# Patient Record
Sex: Male | Born: 1955 | Race: White | Hispanic: No | Marital: Married | Smoking: Current every day smoker
Health system: Southern US, Community
[De-identification: ages and names within clinical notes are randomized; demographics above are authoritative.]

## PROBLEM LIST (undated history)

## (undated) DIAGNOSIS — S83419A Sprain of medial collateral ligament of unspecified knee, initial encounter: Secondary | ICD-10-CM

## (undated) DIAGNOSIS — E119 Type 2 diabetes mellitus without complications: Secondary | ICD-10-CM

## (undated) DIAGNOSIS — J0510 Acute epiglottitis without obstruction: Secondary | ICD-10-CM

## (undated) DIAGNOSIS — S83207A Unspecified tear of unspecified meniscus, current injury, left knee, initial encounter: Secondary | ICD-10-CM

## (undated) DIAGNOSIS — M1990 Unspecified osteoarthritis, unspecified site: Secondary | ICD-10-CM

## (undated) DIAGNOSIS — S83512A Sprain of anterior cruciate ligament of left knee, initial encounter: Secondary | ICD-10-CM

## (undated) DIAGNOSIS — T148XXA Other injury of unspecified body region, initial encounter: Secondary | ICD-10-CM

## (undated) HISTORY — PX: KNEE SURGERY: SHX244

## (undated) HISTORY — DX: Unspecified tear of unspecified meniscus, current injury, left knee, initial encounter: S83.207A

## (undated) HISTORY — DX: Other injury of unspecified body region, initial encounter: T14.8XXA

## (undated) HISTORY — PX: VASECTOMY: SHX75

## (undated) HISTORY — DX: Sprain of medial collateral ligament of unspecified knee, initial encounter: S83.419A

## (undated) HISTORY — DX: Sprain of anterior cruciate ligament of left knee, initial encounter: S83.512A

## (undated) HISTORY — DX: Type 2 diabetes mellitus without complications: E11.9

## (undated) HISTORY — DX: Acute epiglottitis without obstruction: J05.10

## (undated) HISTORY — PX: TONSILLECTOMY: SUR1361

---

## 2012-01-08 ENCOUNTER — Ambulatory Visit: Payer: Self-pay | Admitting: Family Medicine

## 2013-11-03 ENCOUNTER — Emergency Department: Payer: Self-pay | Admitting: Emergency Medicine

## 2014-09-06 ENCOUNTER — Encounter: Payer: Self-pay | Admitting: Family Medicine

## 2014-09-06 ENCOUNTER — Ambulatory Visit (INDEPENDENT_AMBULATORY_CARE_PROVIDER_SITE_OTHER): Payer: BLUE CROSS/BLUE SHIELD | Admitting: Family Medicine

## 2014-09-06 VITALS — BP 117/75 | HR 83 | Temp 98.7°F | Ht 70.0 in | Wt 236.0 lb

## 2014-09-06 DIAGNOSIS — E1142 Type 2 diabetes mellitus with diabetic polyneuropathy: Secondary | ICD-10-CM | POA: Diagnosis not present

## 2014-09-06 DIAGNOSIS — E119 Type 2 diabetes mellitus without complications: Secondary | ICD-10-CM | POA: Insufficient documentation

## 2014-09-06 DIAGNOSIS — G629 Polyneuropathy, unspecified: Secondary | ICD-10-CM | POA: Diagnosis not present

## 2014-09-06 DIAGNOSIS — E785 Hyperlipidemia, unspecified: Secondary | ICD-10-CM | POA: Diagnosis not present

## 2014-09-06 NOTE — Progress Notes (Signed)
BP 117/75 mmHg  Pulse 83  Temp(Src) 98.7 F (37.1 C)  Ht 5\' 10"  (1.778 m)  Wt 236 lb (107.049 kg)  BMI 33.86 kg/m2  SpO2 94%   Subjective:    Patient ID: Randall Bowman, male    DOB: [birth year], 59 y.o.   MRN: [id number]  HPI: Randall Bowman is a 59 y.o. male  Chief Complaint  Patient presents with  . Diabetes   patient follow-up from [place] in May for reestablishing care for diabetes wants to reestablish care here. Patient is taken medical advice to heart and is Bowman really well losing 12 pounds and only taking metformin 752 a day. Is taking this medication faithfully with no side effects and no low blood sugar spells. Patient's home glucose monitoring shows slow gradual decline into the low to mid 100s range. His tests are all fasting. Is not been checking nonfasting glucoses. Reviewed notes from [place] and labs. Indicating elevated cholesterol normal renal function and of course poor control of diabetes. Patient has been going to his eye doctor on a regular basis will see him again this fall and will have a report sent to [place]. The condition that drove the patient to the doctor in the first place of peripheral neuropathy continues unabated with continued marked lethargy.. The lethargy has improved markedly. Patient wants to defer any neuropathy treatment for the time being to see a full improved spontaneously. Relevant past medical, surgical, family and social history reviewed and updated as indicated. Interim medical history since our last visit reviewed. Allergies and medications reviewed and updated.  Review of Systems  Constitutional: Negative.   Respiratory: Negative.   Cardiovascular: Negative.     Per HPI unless specifically indicated above     Objective:    BP 117/75 mmHg  Pulse 83  Temp(Src) 98.7 F (37.1 C)  Ht 5\' 10"  (1.778 m)  Wt 236 lb (107.049 kg)  BMI 33.86 kg/m2  SpO2 94%  Wt Readings from Last 3 Encounters:  09/06/14 236 lb (107.049 kg)  01/08/12  257 lb (116.574 kg)    Physical Exam  Constitutional: He is oriented to person, place, and time. He appears well-developed and well-nourished. No distress.  HENT:  Head: Normocephalic and atraumatic.  Right Ear: Hearing normal.  Left Ear: Hearing normal.  Nose: Nose normal.  Eyes: Conjunctivae and lids are normal. Right eye exhibits no discharge. Left eye exhibits no discharge. No scleral icterus.  Pulmonary/Chest: Effort normal. No respiratory distress. He has wheezes.  Musculoskeletal: Normal range of motion.  Neurological: He is alert and oriented to person, place, and time.  Foot exam actually normal with normal sensation but his feet do hurt  Skin: Skin is intact. No rash noted.  Psychiatric: He has a normal mood and affect. His speech is normal and behavior is normal. Judgment and thought content normal. Cognition and memory are normal.    No results found for this or any previous visit.    Assessment & Plan:   Problem List Items Addressed This Visit      Nervous and Auditory   Diabetic peripheral neuropathy associated with type 2 diabetes mellitus - Primary    Diabetes with marked improvement with weight loss and starting metformin      Relevant Medications   metFORMIN (GLUCOPHAGE-XR) 750 MG 24 hr tablet   Other Relevant Orders   Bayer DCA Hb A1c Waived   Basic metabolic panel     Other   Hyperlipidemia   Relevant Orders  LP+ALT+AST+Glu Piccolo, Waived       Follow up plan: Return in about 2 months (around 11/06/2014) for recheck.

## 2014-09-06 NOTE — Assessment & Plan Note (Signed)
Diabetes with marked improvement with weight loss and starting metformin

## 2014-10-07 ENCOUNTER — Encounter: Payer: Self-pay | Admitting: Emergency Medicine

## 2014-10-07 ENCOUNTER — Emergency Department: Payer: BLUE CROSS/BLUE SHIELD

## 2014-10-07 ENCOUNTER — Emergency Department
Admission: EM | Admit: 2014-10-07 | Discharge: 2014-10-07 | Disposition: A | Discharge status: Home / Self Care | Payer: BLUE CROSS/BLUE SHIELD | Attending: Emergency Medicine | Admitting: Emergency Medicine

## 2014-10-07 DIAGNOSIS — E1142 Type 2 diabetes mellitus with diabetic polyneuropathy: Secondary | ICD-10-CM | POA: Diagnosis not present

## 2014-10-07 DIAGNOSIS — Z720 Tobacco use: Secondary | ICD-10-CM | POA: Insufficient documentation

## 2014-10-07 DIAGNOSIS — J029 Acute pharyngitis, unspecified: Secondary | ICD-10-CM | POA: Diagnosis present

## 2014-10-07 DIAGNOSIS — J0510 Acute epiglottitis without obstruction: Secondary | ICD-10-CM | POA: Diagnosis not present

## 2014-10-07 DIAGNOSIS — Z79899 Other long term (current) drug therapy: Secondary | ICD-10-CM | POA: Insufficient documentation

## 2014-10-07 DIAGNOSIS — Z7982 Long term (current) use of aspirin: Secondary | ICD-10-CM | POA: Diagnosis not present

## 2014-10-07 LAB — POCT RAPID STREP A: STREPTOCOCCUS, GROUP A SCREEN (DIRECT): NEGATIVE

## 2014-10-07 MED ORDER — MAGIC MOUTHWASH W/LIDOCAINE: 5.0000 mL | Freq: Four times a day (QID) | ORAL | Status: DC

## 2014-10-07 MED ORDER — DEXAMETHASONE SODIUM PHOSPHATE 10 MG/ML IJ SOLN
10.0000 mg | Freq: Once | INTRAMUSCULAR | Status: AC
  Administered 2014-10-07: 10 mg via INTRAMUSCULAR
  Filled 2014-10-07: qty 1

## 2014-10-07 MED ORDER — CEFTRIAXONE SODIUM 1 G IJ SOLR
1.0000 g | Freq: Once | INTRAMUSCULAR | Status: AC
  Administered 2014-10-07: 1 g via INTRAMUSCULAR
  Filled 2014-10-07: qty 10

## 2014-10-07 MED ORDER — METHYLPREDNISOLONE 4 MG PO TBPK: ORAL_TABLET | ORAL | Status: DC

## 2014-10-07 MED ORDER — LIDOCAINE HCL (PF) 1 % IJ SOLN
INTRAMUSCULAR | Status: AC
  Administered 2014-10-07: 1 mL
  Filled 2014-10-07: qty 5

## 2014-10-07 MED ORDER — TRAMADOL HCL 50 MG PO TABS: 50.0000 mg | ORAL_TABLET | Freq: Four times a day (QID) | ORAL | Status: DC | PRN

## 2014-10-07 MED ORDER — AMOXICILLIN-POT CLAVULANATE 875-125 MG PO TABS: 1.0000 | ORAL_TABLET | Freq: Two times a day (BID) | ORAL | Status: DC

## 2014-10-07 MED ORDER — HYDROMORPHONE HCL 1 MG/ML IJ SOLN
1.0000 mg | Freq: Once | INTRAMUSCULAR | Status: AC
  Administered 2014-10-07: 1 mg via INTRAMUSCULAR
  Filled 2014-10-07: qty 1

## 2014-10-07 NOTE — Discharge Instructions (Signed)
Take medications as directed and follow up in 48 hours .  Return to ER if condition worsen.

## 2014-10-07 NOTE — ED Provider Notes (Addendum)
[facility] Emergency Department Provider Note  ____________________________________________  Time seen: Approximately 7:10 PM  I have reviewed the triage vital signs and the nursing notes.   HISTORY  Chief Complaint Sore Throat    HPI Randall Bowman is a 59 y.o. male patient complains that they of sore throat and difficulty swallowing. Patient is concerned secondary to being diagnosed with epiglottitis last year having the same complaint and feels at this time. Patient denies any URI signs and symptoms. Patient denies any fever. Patient states they've to tolerate fluids but having difficulty with solid foods. Past Medical History  Diagnosis Date  . Tears of meniscus and ACL of left knee   . Tear of MCL (medial collateral ligament) of knee   . Joint capsule rupture   . Diabetes mellitus without complication     Patient Active Problem List   Diagnosis Date Noted  . Diabetic peripheral neuropathy associated with type 2 diabetes mellitus 09/06/2014  . Hyperlipidemia 09/06/2014  . Diabetes mellitus without complication     Past Surgical History  Procedure Laterality Date  . Vasectomy    . Tonsillectomy      Current Outpatient Rx  Name  Route  Sig  Dispense  Refill  . amoxicillin-clavulanate (AUGMENTIN) 875-125 MG per tablet   Oral   Take 1 tablet by mouth 2 (two) times daily.   20 tablet   0   . aspirin EC 81 MG tablet   Oral   Take 81 mg by mouth daily.         . magic mouthwash w/lidocaine SOLN   Oral   Take 5 mLs by mouth 4 (four) times daily.   100 mL   0   . metFORMIN (GLUCOPHAGE-XR) 750 MG 24 hr tablet   Oral   Take by mouth.         . methylPREDNISolone (MEDROL DOSEPAK) 4 MG TBPK tablet      Take Tapered dose as directed   21 tablet   0   . traMADol (ULTRAM) 50 MG tablet   Oral   Take 1 tablet (50 mg total) by mouth every 6 (six) hours as needed for moderate pain.   12 tablet   0   . vardenafil (LEVITRA) 20 MG  tablet   Oral   Take 20 mg by mouth daily as needed for erectile dysfunction.           Allergies Review of patient's allergies indicates no known allergies.  Family History  Problem Relation Age of Onset  . Cancer Father   . Cancer Brother     Social History Social History  Substance Use Topics  . Smoking status: Current Every Day Smoker    Types: Cigarettes  . Smokeless tobacco: Never Used  . Alcohol Use: Yes    Review of Systems Constitutional: No fever/chills Eyes: No visual changes. ENT: No sore throat. Cardiovascular: Denies chest pain. Respiratory: Denies shortness of breath. Gastrointestinal: No abdominal pain.  No nausea, no vomiting.  No diarrhea.  No constipation. Genitourinary: Negative for dysuria. Musculoskeletal: Negative for back pain. Skin: Negative for rash. Neurological: Negative for headaches, focal weakness or numbness. 10-point ROS otherwise negative.  ____________________________________________   PHYSICAL EXAM:  VITAL SIGNS: ED Triage Vitals  Enc Vitals Group     BP 10/07/14 1850 130/75 mmHg     Pulse Rate 10/07/14 1850 87     Resp 10/07/14 1850 18     Temp 10/07/14 1850 98.3 F (36.8 C)  Temp Source 10/07/14 1850 Oral     SpO2 10/07/14 1850 96 %     Weight 10/07/14 1850 232 lb (105.235 kg)     Height 10/07/14 1850 5\' 10"  (1.778 m)     Head Cir --      Peak Flow --      Pain Score 10/07/14 1901 6     Pain Loc --      Pain Edu? --      Excl. in [place]? --     Constitutional: Alert and oriented. Well appearing and in no acute distress. Eyes: Conjunctivae are normal. PERRL. EOMI. Head: Atraumatic. Nose: No congestion/rhinnorhea. Mouth/Throat: Mucous membranes are moist.  Oropharynx erythematous. Neck: No stridor.   Hematological/Lymphatic/Immunilogical: No cervical lymphadenopathy. Cardiovascular: Normal rate, regular rhythm. Grossly normal heart sounds.  Good peripheral circulation. Respiratory: Normal respiratory effort.   No retractions. Lungs CTAB. Gastrointestinal: Soft and nontender. No distention. No abdominal bruits. No CVA tenderness. Musculoskeletal: No lower extremity tenderness nor edema.  No joint effusions. Neurologic:  Normal speech and language. No gross focal neurologic deficits are appreciated. No gait instability. Skin:  Skin is warm, dry and intact. No rash noted. Psychiatric: Mood and affect are normal. Speech and behavior are normal.  ____________________________________________   LABS (all labs ordered are listed, but only abnormal results are displayed)  Labs Reviewed  POCT RAPID STREP A   ____________________________________________  EKG   ____________________________________________  RADIOLOGY  Soft tissue neck findings consistent with mild epiglottitis. ____________________________________________   PROCEDURES  Procedure(s) performed: None  Critical Care performed: No  ____________________________________________   INITIAL IMPRESSION / ASSESSMENT AND PLAN / ED COURSE  Pertinent labs & imaging results that were available during my care of the patient were reviewed by me and considered in my medical decision making (see chart for details).  Epiglottitis. Patient given Decadron and Rocephin IM in the ER. Patient discharged with Augmentin and prednisone. Patient also given a prescription for Magic mouthwash. Patient is advised to follow-up with family doctor in 2 days. Return by ER if condition worsens. ____________________________________________   FINAL CLINICAL IMPRESSION(S) / ED DIAGNOSES  Final diagnoses:  Acute epiglottiditis      Sable Feil, PA-C 10/07/14 2139  Orbie Pyo, MD 10/08/14 0008  Called the patient at home. He said that he is feeling improved and is able to swallow and is controlling his secretions. He says that he has been treated successfully outpatient with epiglottitis before by an ear nose and throat doctor  [place]. He says that he feels exactly the same as when he was last treated successfully as an outpatient about one year ago. I recommended to him that he come back to the hospital to be admitted for this diagnosis. However, the patient says that he would prefer to stay at home and that he lives only 2 minutes away from the hospital. He says his wife is there to supervise him. He is aware of the potentially rapid airway obstruction and catastrophic consequences of worsening epiglottitis. Despite this he still is deciding to be treated as an outpatient. He did not sound intoxicated and had clear insight into his disease.  Orbie Pyo, MD 10/08/14 [phone]  Patient called back to the emergency department to ask for my name.  He thanked me for my concern in calling him but says that his wife will likely be filing a complaint.  I again recommended that he return to the emergency department but he says that he would still rather  continue his treatment at home. He is aware that this diagnosis may result in closure of his airway and death and that the disease may progress rapidly.  Despite this, he still would rather be treated from home.    Orbie Pyo, MD 10/08/14 [phone]

## 2014-10-07 NOTE — ED Notes (Signed)
Patient D/C in wife's care.

## 2014-10-10 ENCOUNTER — Ambulatory Visit (INDEPENDENT_AMBULATORY_CARE_PROVIDER_SITE_OTHER): Payer: BLUE CROSS/BLUE SHIELD | Admitting: Family Medicine

## 2014-10-10 ENCOUNTER — Encounter: Payer: Self-pay | Admitting: Family Medicine

## 2014-10-10 VITALS — BP 116/75 | HR 75 | Temp 97.4°F | Wt 234.0 lb

## 2014-10-10 DIAGNOSIS — J0510 Acute epiglottitis without obstruction: Secondary | ICD-10-CM | POA: Diagnosis not present

## 2014-10-10 MED ORDER — PREDNISONE 10 MG PO TABS: ORAL_TABLET | ORAL | Status: DC

## 2014-10-10 NOTE — Progress Notes (Signed)
BP 116/75 mmHg  Pulse 75  Temp(Src) 97.4 F (36.3 C)  Wt 234 lb (106.142 kg)  SpO2 97%   Subjective:    Patient ID: Randall Bowman, male    DOB: 20-Sep-1955, 59 y.o.   MRN: [id number]  HPI: Randall Bowman is a 59 y.o. male  Chief Complaint  Patient presents with  . ER follow up    Acute epiglottiditis- was given steriods and antibiotics which he is currently taking he was also given pain medications which he does does not take.Patient states that he felt better yesterday than he did today.   ER FOLLOW UP Time since discharge: 3 days Hospital/facility: [facility] Diagnosis: Acute epiglotitis Procedures/tests: x-ray, rapid strep (neg) Consultants: None New medications: prednisone taper, augmentin, magic mouth wash Discharge instructions:  Follow up with PCP Status: better- starting to feel sore in his throat again on the 40mg  of prednisone.   Has had epiglottitis 2 times previously, 1 time was in ICU for several days, last time was treated by ENT. This was in [place] several years ago. Never found out why he was getting it. This time, went to hospital and sat for 4 hours, then sent home with medrol dose pack and augmentin. Doctor called him several hours later and asked him to come back to the ER for admission, but he refused. He states that he is feeling better, controlling his secretions and is OK, but not feeling right. Taking his medicine as recommended.   Relevant past medical, surgical, family and social history reviewed and updated as indicated. Interim medical history since our last visit reviewed. Allergies and medications reviewed and updated.  Review of Systems  Constitutional: Negative.   HENT: Positive for sore throat and voice change. Negative for congestion, dental problem, drooling, ear discharge, ear pain, facial swelling, hearing loss, mouth sores, nosebleeds, postnasal drip, rhinorrhea, sinus pressure, sneezing, tinnitus and trouble swallowing.   Respiratory:  Negative.   Cardiovascular: Negative.   Skin: Negative.     Per HPI unless specifically indicated above     Objective:    BP 116/75 mmHg  Pulse 75  Temp(Src) 97.4 F (36.3 C)  Wt 234 lb (106.142 kg)  SpO2 97%  Wt Readings from Last 3 Encounters:  10/10/14 234 lb (106.142 kg)  10/07/14 232 lb (105.235 kg)  09/06/14 236 lb (107.049 kg)    Physical Exam  Constitutional: He is oriented to person, place, and time. He appears well-developed and well-nourished. No distress.  HENT:  Head: Normocephalic and atraumatic.  Right Ear: Hearing normal.  Left Ear: Hearing normal.  Nose: Nose normal.  Mouth/Throat: Oropharynx is clear and moist. No oropharyngeal exudate.  Eyes: Conjunctivae and lids are normal. Right eye exhibits no discharge. Left eye exhibits no discharge. No scleral icterus.  Neck: No JVD present. No tracheal deviation present. No thyromegaly present.  Swelling under his jaw  Cardiovascular: Normal rate, regular rhythm and intact distal pulses.  Exam reveals no gallop and no friction rub.   No murmur heard. Pulmonary/Chest: Effort normal and breath sounds normal. No stridor. No respiratory distress. He has no wheezes. He has no rales. He exhibits no tenderness.  Musculoskeletal: Normal range of motion.  Lymphadenopathy:    He has cervical adenopathy.  Neurological: He is alert and oriented to person, place, and time.  Skin: Skin is intact. No rash noted.  Psychiatric: He has a normal mood and affect. His speech is normal and behavior is normal. Judgment and thought content normal. Cognition and memory are  normal.  Nursing note and vitals reviewed.   Results for orders placed or performed during the hospital encounter of 10/07/14  POCT rapid strep A [facility])  Result Value Ref Range   Streptococcus, Group A Screen (Direct) NEGATIVE NEGATIVE      Assessment & Plan:   Problem List Items Addressed This Visit      Respiratory   Acute epiglottitis without  obstruction - Primary    Day 4- on antibiotics, controlling secretions, will increase his prednisone to 3 day 60 mg taper, and have him back here in 1-2 days to see how he is doing. Advised him to return to the hospital immediately if he feels at all short of breath or has trouble controlling his secretions. He is aware of the severity of this illness. Referral to ENT generated today- patient has appointment set up with them on 10/24/14-  He will return here on Thursday.       Relevant Orders   Ambulatory referral to ENT       Follow up plan: Return in about 2 days (around 10/12/2014).

## 2014-10-10 NOTE — Assessment & Plan Note (Addendum)
Day 4- on antibiotics, controlling secretions, will increase his prednisone to 3 day 60 mg taper, and have him back here in 1-2 days to see how he is doing. Advised him to return to the hospital immediately if he feels at all short of breath or has trouble controlling his secretions. He is aware of the severity of this illness. Referral to ENT generated today- patient has appointment set up with them on 10/24/14-  He will return here on Thursday.

## 2014-10-12 ENCOUNTER — Ambulatory Visit (INDEPENDENT_AMBULATORY_CARE_PROVIDER_SITE_OTHER): Payer: BLUE CROSS/BLUE SHIELD | Admitting: Family Medicine

## 2014-10-12 ENCOUNTER — Encounter: Payer: Self-pay | Admitting: Family Medicine

## 2014-10-12 VITALS — BP 127/82 | HR 72 | Temp 96.4°F | Wt 233.0 lb

## 2014-10-12 DIAGNOSIS — J0510 Acute epiglottitis without obstruction: Secondary | ICD-10-CM

## 2014-10-12 NOTE — Progress Notes (Signed)
BP 127/82 mmHg  Pulse 72  Temp(Src) 96.4 F (35.8 C)  Wt 233 lb (105.688 kg)  SpO2 98%   Subjective:    Patient ID: Randall Bowman, male    DOB: [birth year], 59 y.o.   MRN: [id number]  HPI: Randall Bowman is a 59 y.o. male  Chief Complaint  Patient presents with  . Sore Throat   No problems swallowing, feeling much better. He does note that he is hungry and angry with the prednisone and that his sugars have been really up. He's concerned about his A1c. He is seeing the ENT in 1 week. No other concerns or complaints.   Relevant past medical, surgical, family and social history reviewed and updated as indicated. Interim medical history since our last visit reviewed. Allergies and medications reviewed and updated.  Review of Systems  Constitutional: Negative.   HENT: Negative.   Respiratory: Negative.   Cardiovascular: Negative.   Psychiatric/Behavioral: Negative.     Per HPI unless specifically indicated above     Objective:    BP 127/82 mmHg  Pulse 72  Temp(Src) 96.4 F (35.8 C)  Wt 233 lb (105.688 kg)  SpO2 98%  Wt Readings from Last 3 Encounters:  10/12/14 233 lb (105.688 kg)  10/10/14 234 lb (106.142 kg)  10/07/14 232 lb (105.235 kg)    Physical Exam  Constitutional: He is oriented to person, place, and time. He appears well-developed and well-nourished. No distress.  HENT:  Head: Normocephalic and atraumatic.  Right Ear: Hearing normal.  Left Ear: Hearing normal.  Nose: Nose normal.  Mouth/Throat: Oropharynx is clear and moist. No oropharyngeal exudate.  Eyes: Conjunctivae and lids are normal. Right eye exhibits no discharge. Left eye exhibits no discharge. No scleral icterus.  Neck: Normal range of motion. No JVD present. No tracheal deviation present. No thyromegaly present.  Cardiovascular: Normal rate, regular rhythm, normal heart sounds and intact distal pulses.  Exam reveals no gallop and no friction rub.   No murmur heard. Pulmonary/Chest: Effort  normal and breath sounds normal. No stridor. No respiratory distress. He has no wheezes. He has no rales. He exhibits no tenderness.  Musculoskeletal: Normal range of motion.  Lymphadenopathy:    He has no cervical adenopathy.  Neurological: He is alert and oriented to person, place, and time.  Skin: Skin is intact. No rash noted.  Psychiatric: He has a normal mood and affect. His speech is normal and behavior is normal. Judgment and thought content normal. Cognition and memory are normal.  Nursing note and vitals reviewed.   Results for orders placed or performed during the hospital encounter of 10/07/14  POCT rapid strep A [facility])  Result Value Ref Range   Streptococcus, Group A Screen (Direct) NEGATIVE NEGATIVE      Assessment & Plan:   Problem List Items Addressed This Visit      Respiratory   Acute epiglottitis without obstruction - Primary    Doing well on the prednisone. To see ENT on the 23rd. He will call if pain increases or he feels more swollen when he goes down to the 50mg  of prednisone.           Follow up plan: Return As scheduled.

## 2014-10-12 NOTE — Assessment & Plan Note (Signed)
Doing well on the prednisone. To see ENT on the 23rd. He will call if pain increases or he feels more swollen when he goes down to the 50mg  of prednisone.

## 2014-11-02 ENCOUNTER — Ambulatory Visit: Payer: BLUE CROSS/BLUE SHIELD | Admitting: Family Medicine

## 2014-11-07 ENCOUNTER — Other Ambulatory Visit (INDEPENDENT_AMBULATORY_CARE_PROVIDER_SITE_OTHER): Payer: BLUE CROSS/BLUE SHIELD

## 2014-11-07 DIAGNOSIS — E785 Hyperlipidemia, unspecified: Secondary | ICD-10-CM

## 2014-11-07 DIAGNOSIS — E1142 Type 2 diabetes mellitus with diabetic polyneuropathy: Secondary | ICD-10-CM

## 2014-11-07 DIAGNOSIS — Z23 Encounter for immunization: Secondary | ICD-10-CM | POA: Diagnosis not present

## 2014-11-07 LAB — BAYER DCA HB A1C WAIVED: HB A1C (BAYER DCA - WAIVED): 7.4 % — ABNORMAL HIGH (ref <7.0)

## 2014-11-07 LAB — LP+ALT+AST+GLU PICCOLO, WAIVED
ALT (SGPT) Piccolo, Waived: 25 U/L (ref 10–47)
AST (SGOT) Piccolo, Waived: 27 U/L (ref 11–38)
CHOL/HDL RATIO PICCOLO,WAIVE: 6.8 mg/dL — AB
Cholesterol Piccolo, Waived: 200 mg/dL — ABNORMAL HIGH (ref <200)
Glucose Piccolo, Waived: 149 mg/dL — ABNORMAL HIGH (ref 73–118)
HDL CHOL PICCOLO, WAIVED: 29 mg/dL — AB (ref >59)
LDL CHOL CALC PICCOLO WAIVED: 135 mg/dL — AB (ref <100)
TRIGLYCERIDES PICCOLO,WAIVED: 180 mg/dL — AB (ref <150)
VLDL CHOL CALC PICCOLO,WAIVE: 36 mg/dL — AB (ref <30)

## 2014-11-08 LAB — BASIC METABOLIC PANEL
BUN/Creatinine Ratio: 19 (ref 9–20)
BUN: 15 mg/dL (ref 6–24)
CALCIUM: 10.6 mg/dL — AB (ref 8.7–10.2)
CHLORIDE: 102 mmol/L (ref 97–108)
CO2: 24 mmol/L (ref 18–29)
Creatinine, Ser: 0.78 mg/dL (ref 0.76–1.27)
GFR calc Af Amer: 115 mL/min/{1.73_m2} (ref >59)
GFR, EST NON AFRICAN AMERICAN: 99 mL/min/{1.73_m2} (ref >59)
Glucose: 144 mg/dL — ABNORMAL HIGH (ref 65–99)
POTASSIUM: 5.8 mmol/L — AB (ref 3.5–5.2)
Sodium: 140 mmol/L (ref 134–144)

## 2014-11-13 ENCOUNTER — Telehealth: Payer: Self-pay | Admitting: Family Medicine

## 2014-11-13 DIAGNOSIS — E875 Hyperkalemia: Secondary | ICD-10-CM

## 2014-11-13 DIAGNOSIS — E876 Hypokalemia: Secondary | ICD-10-CM

## 2014-11-13 NOTE — Telephone Encounter (Signed)
-----   Message from Wynn Maudlin, [place] sent at 11/13/2014  5:01 PM EDT ----- labs

## 2014-11-13 NOTE — Telephone Encounter (Signed)
Phone call Discussed with patient elevated potassium will recheck again later this week or next.

## 2014-11-30 ENCOUNTER — Ambulatory Visit: Payer: BLUE CROSS/BLUE SHIELD | Admitting: Family Medicine

## 2014-12-06 ENCOUNTER — Encounter: Payer: Self-pay | Admitting: Family Medicine

## 2014-12-06 ENCOUNTER — Ambulatory Visit (INDEPENDENT_AMBULATORY_CARE_PROVIDER_SITE_OTHER): Payer: BLUE CROSS/BLUE SHIELD | Admitting: Family Medicine

## 2014-12-06 VITALS — BP 133/81 | HR 94 | Temp 98.4°F | Ht 70.2 in | Wt 231.0 lb

## 2014-12-06 DIAGNOSIS — R221 Localized swelling, mass and lump, neck: Secondary | ICD-10-CM | POA: Diagnosis not present

## 2014-12-06 DIAGNOSIS — E1142 Type 2 diabetes mellitus with diabetic polyneuropathy: Secondary | ICD-10-CM

## 2014-12-06 DIAGNOSIS — Z1211 Encounter for screening for malignant neoplasm of colon: Secondary | ICD-10-CM | POA: Diagnosis not present

## 2014-12-06 DIAGNOSIS — Z0000 Encounter for general adult medical examination without abnormal findings: Secondary | ICD-10-CM

## 2014-12-06 NOTE — Assessment & Plan Note (Signed)
Doing better.   

## 2014-12-06 NOTE — Progress Notes (Signed)
BP 133/81 mmHg  Pulse 94  Temp(Src) 98.4 F (36.9 C)  Ht 5' 10.2" (1.783 m)  Wt 231 lb (104.781 kg)  BMI 32.96 kg/m2  SpO2 94%   Subjective:    Patient ID: Randall Bowman, male    DOB: August 14, 1955, 59 y.o.   MRN: [id number]  HPI: Randall Bowman is a 59 y.o. male  Chief Complaint  Patient presents with  . Annual Exam   Asian doing well with diabetes no complaints from metformin 750 twice a day. Has noted low blood sugar spells weight remains stable. No side effects from medications. Home glucose monitoring since indicating good control with mostly in the low 100s occasional excursions when these had some dietary indiscretion Cholesterol screening at work and her cholesterol was 200 same as it was here in October No real expected changes no serious dietary attempts of been made.  Feet have been doing well was watching after his feet numbness is improved but still present. Also having some numbness in left hand fingertips first 3 fingers Relevant past medical, surgical, family and social history reviewed and updated as indicated. Interim medical history since our last visit reviewed. Allergies and medications reviewed and updated.  Review of Systems  Constitutional: Negative.   HENT: Negative.        For the last 3 or 4 months patient's noticed some right mandibular area swelling doesn't change with eating hasn't been sore tender as a gotten bigger or smaller No dental complaints ear complaints  Eyes: Negative.   Respiratory: Negative.   Cardiovascular: Negative.   Gastrointestinal: Negative.   Endocrine: Negative.   Genitourinary: Negative.   Musculoskeletal: Negative.   Skin: Negative.   Allergic/Immunologic: Negative.   Neurological: Negative.   Hematological: Negative.   Psychiatric/Behavioral: Negative.     Per HPI unless specifically indicated above     Objective:    BP 133/81 mmHg  Pulse 94  Temp(Src) 98.4 F (36.9 C)  Ht 5' 10.2" (1.783 m)  Wt 231  lb (104.781 kg)  BMI 32.96 kg/m2  SpO2 94%  Wt Readings from Last 3 Encounters:  12/06/14 231 lb (104.781 kg)  10/12/14 233 lb (105.688 kg)  10/10/14 234 lb (106.142 kg)    Physical Exam  Constitutional: He is oriented to person, place, and time. He appears well-developed and well-nourished.  HENT:  Head: Normocephalic and atraumatic.  Right Ear: External ear normal.  Left Ear: External ear normal.  Right mandibular areas swelling at the junction with ear approximate 2 cm nodule lymph node. Slightly movable nontender  Eyes: Conjunctivae and EOM are normal. Pupils are equal, round, and reactive to light.  Neck: Normal range of motion. Neck supple.  Cardiovascular: Normal rate, regular rhythm, normal heart sounds and intact distal pulses.   Pulmonary/Chest: Effort normal and breath sounds normal.  Abdominal: Soft. Bowel sounds are normal. There is no splenomegaly or hepatomegaly.  Genitourinary: Rectum normal, prostate normal and penis normal.  Musculoskeletal: Normal range of motion.  Neurological: He is alert and oriented to person, place, and time. He has normal reflexes.  Skin: No rash noted. No erythema.  Psychiatric: He has a normal mood and affect. His behavior is normal. Judgment and thought content normal.    Results for orders placed or performed in visit on 11/07/14  Bayer DCA Hb A1c Waived  Result Value Ref Range   Bayer DCA Hb A1c Waived 7.4 (H) <7.8 %  Basic metabolic panel  Result Value Ref Range   Glucose 144 (H)  65 - 99 mg/dL   BUN 15 6 - 24 mg/dL   Creatinine, Ser 0.78 0.76 - 1.27 mg/dL   GFR calc non Af Amer 99 >59 mL/min/1.73   GFR calc Af Amer 115 >59 mL/min/1.73   BUN/Creatinine Ratio 19 9 - 20   Sodium 140 134 - 144 mmol/L   Potassium 5.8 (H) 3.5 - 5.2 mmol/L   Chloride 102 97 - 108 mmol/L   CO2 24 18 - 29 mmol/L   Calcium 10.6 (H) 8.7 - 10.2 mg/dL  LP+ALT+AST+Glu Piccolo, Waived  Result Value Ref Range   ALT (SGPT) Piccolo, Waived 25 10 - 47 U/L    AST (SGOT) Piccolo, Waived 27 11 - 38 U/L   Glucose Piccolo, Waived 149 (H) 73 - 118 mg/dL   Cholesterol Piccolo, Waived 200 (H) <200 mg/dL   HDL Chol Piccolo, Waived 29 (L) >59 mg/dL   Triglycerides Piccolo,Waived 180 (H) <150 mg/dL   Chol/HDL Ratio Piccolo,Waive 6.8 (H) mg/dL   LDL Chol Calc Piccolo Waived 135 (H) <100 mg/dL   VLDL Chol Calc Piccolo,Waive 36 (H) <30 mg/dL      Assessment & Plan:   Problem List Items Addressed This Visit      Nervous and Auditory   Diabetic peripheral neuropathy associated with type 2 diabetes mellitus (HCC)    Doing better      Relevant Orders   Microalbumin, Urine Waived     Other   Neck nodule    Right  Neck nodule present 4 months will refer to ear nose and throat to further evaluate      Relevant Orders   Ambulatory referral to ENT    Other Visit Diagnoses    Colon cancer screening    -  Primary    Relevant Orders    Ambulatory referral to General Surgery    PE (physical exam), annual        Relevant Orders    Comprehensive metabolic panel    Lipid panel    CBC with Differential/Platelet    PSA    Urinalysis, Routine w reflex microscopic (not at [facility])    TSH        Follow up plan: Return in about 2 months (around 02/05/2015) for 2 mo a1c.

## 2014-12-06 NOTE — Assessment & Plan Note (Signed)
Right  Neck nodule present 4 months will refer to ear nose and throat to further evaluate

## 2014-12-07 ENCOUNTER — Encounter: Payer: Self-pay | Admitting: Family Medicine

## 2014-12-07 LAB — URINALYSIS, ROUTINE W REFLEX MICROSCOPIC
BILIRUBIN UA: NEGATIVE
Glucose, UA: NEGATIVE
KETONES UA: NEGATIVE
LEUKOCYTES UA: NEGATIVE
Nitrite, UA: NEGATIVE
Protein, UA: NEGATIVE
SPEC GRAV UA: 1.025 (ref 1.005–1.030)
Urobilinogen, Ur: 0.2 mg/dL (ref 0.2–1.0)
pH, UA: 6 (ref 5.0–7.5)

## 2014-12-07 LAB — CBC WITH DIFFERENTIAL/PLATELET
Basophils Absolute: 0 10*3/uL (ref 0.0–0.2)
Basos: 1 %
EOS (ABSOLUTE): 0.3 10*3/uL (ref 0.0–0.4)
EOS: 4 %
HEMATOCRIT: 44.5 % (ref 37.5–51.0)
HEMOGLOBIN: 14.9 g/dL (ref 12.6–17.7)
IMMATURE GRANS (ABS): 0 10*3/uL (ref 0.0–0.1)
IMMATURE GRANULOCYTES: 1 %
LYMPHS: 29 %
Lymphocytes Absolute: 2 10*3/uL (ref 0.7–3.1)
MCH: 27.2 pg (ref 26.6–33.0)
MCHC: 33.5 g/dL (ref 31.5–35.7)
MCV: 81 fL (ref 79–97)
MONOCYTES: 6 %
MONOS ABS: 0.4 10*3/uL (ref 0.1–0.9)
NEUTROS PCT: 59 %
Neutrophils Absolute: 4.3 10*3/uL (ref 1.4–7.0)
Platelets: 214 10*3/uL (ref 150–379)
RBC: 5.48 x10E6/uL (ref 4.14–5.80)
RDW: 14.9 % (ref 12.3–15.4)
WBC: 7.1 10*3/uL (ref 3.4–10.8)

## 2014-12-07 LAB — COMPREHENSIVE METABOLIC PANEL
A/G RATIO: 2 (ref 1.1–2.5)
ALT: 21 IU/L (ref 0–44)
AST: 13 IU/L (ref 0–40)
Albumin: 4.5 g/dL (ref 3.5–5.5)
Alkaline Phosphatase: 63 IU/L (ref 39–117)
BILIRUBIN TOTAL: 0.2 mg/dL (ref 0.0–1.2)
BUN / CREAT RATIO: 16 (ref 9–20)
BUN: 14 mg/dL (ref 6–24)
CALCIUM: 10.5 mg/dL — AB (ref 8.7–10.2)
CO2: 26 mmol/L (ref 18–29)
Chloride: 103 mmol/L (ref 97–106)
Creatinine, Ser: 0.87 mg/dL (ref 0.76–1.27)
GFR, EST AFRICAN AMERICAN: 110 mL/min/{1.73_m2} (ref >59)
GFR, EST NON AFRICAN AMERICAN: 95 mL/min/{1.73_m2} (ref >59)
GLOBULIN, TOTAL: 2.3 g/dL (ref 1.5–4.5)
Glucose: 145 mg/dL — ABNORMAL HIGH (ref 65–99)
POTASSIUM: 5.1 mmol/L (ref 3.5–5.2)
SODIUM: 145 mmol/L — AB (ref 136–144)
TOTAL PROTEIN: 6.8 g/dL (ref 6.0–8.5)

## 2014-12-07 LAB — MICROSCOPIC EXAMINATION: WBC UA: NONE SEEN /HPF (ref 0–?)

## 2014-12-07 LAB — LIPID PANEL
Chol/HDL Ratio: 8.8 ratio units — ABNORMAL HIGH (ref 0.0–5.0)
Cholesterol, Total: 230 mg/dL — ABNORMAL HIGH (ref 100–199)
HDL: 26 mg/dL — ABNORMAL LOW (ref >39)
LDL Calculated: 132 mg/dL — ABNORMAL HIGH (ref 0–99)
Triglycerides: 360 mg/dL — ABNORMAL HIGH (ref 0–149)

## 2014-12-07 LAB — MICROALBUMIN, URINE WAIVED
Creatinine, Urine Waived: 200 mg/dL (ref 10–300)
MICROALB, UR WAIVED: 10 mg/L (ref 0–19)
Microalb/Creat Ratio: <30 mg/g (ref <30)

## 2014-12-07 LAB — PSA: Prostate Specific Ag, Serum: 1.2 ng/mL (ref 0.0–4.0)

## 2014-12-07 LAB — TSH: TSH: 0.842 u[IU]/mL (ref 0.450–4.500)

## 2014-12-11 ENCOUNTER — Other Ambulatory Visit: Payer: Self-pay

## 2014-12-11 ENCOUNTER — Telehealth: Payer: Self-pay

## 2014-12-11 ENCOUNTER — Other Ambulatory Visit: Payer: Self-pay | Admitting: Otolaryngology

## 2014-12-11 DIAGNOSIS — R221 Localized swelling, mass and lump, neck: Secondary | ICD-10-CM

## 2014-12-11 NOTE — Telephone Encounter (Signed)
Gastroenterology Pre-Procedure Review  Request Date: 01/12/15 Requesting Physician: Dr. Golden Pop  PATIENT REVIEW QUESTIONS: The patient responded to the following health history questions as indicated:    1. Are you having any GI issues? no 2. Do you have a personal history of Polyps? no 3. Do you have a family history of Colon Cancer or Polyps? no 4. Diabetes Mellitus? yes (Type 2) 5. Joint replacements in the past 12 months?no 6. Major health problems in the past 3 months?no 7. Any artificial heart valves, MVP, or defibrillator?no    MEDICATIONS & ALLERGIES:    Patient reports the following regarding taking any anticoagulation/antiplatelet therapy:   Plavix, Coumadin, Eliquis, Xarelto, Lovenox, Pradaxa, Brilinta, or Effient? no Aspirin? yes (ASA 81mg )  Patient confirms/reports the following medications:  Current Outpatient Prescriptions  Medication Sig Dispense Refill  . aspirin EC 81 MG tablet Take 81 mg by mouth daily.    . metFORMIN (GLUCOPHAGE-XR) 750 MG 24 hr tablet Take 1,500 mg by mouth.      No current facility-administered medications for this visit.    Patient confirms/reports the following allergies:  No Known Allergies  No orders of the defined types were placed in this encounter.    AUTHORIZATION INFORMATION Primary Insurance: 1D#: Group #:  Secondary Insurance: 1D#: Group #:  SCHEDULE INFORMATION: Date: 01/12/15 Time: Location: [place]

## 2014-12-15 ENCOUNTER — Ambulatory Visit: Admission: RE | Admit: 2014-12-15 | Payer: BLUE CROSS/BLUE SHIELD | Source: Ambulatory Visit

## 2014-12-25 ENCOUNTER — Encounter
Admission: RE | Admit: 2014-12-25 | Discharge: 2014-12-25 | Disposition: A | Discharge status: Home / Self Care | Payer: BLUE CROSS/BLUE SHIELD | Source: Ambulatory Visit | Attending: Otolaryngology | Admitting: Otolaryngology

## 2014-12-25 DIAGNOSIS — Z01812 Encounter for preprocedural laboratory examination: Secondary | ICD-10-CM | POA: Diagnosis not present

## 2014-12-25 HISTORY — DX: Unspecified osteoarthritis, unspecified site: M19.90

## 2014-12-25 NOTE — Patient Instructions (Signed)
  Your procedure is scheduled on: January 03, 2015 (Wednesday) Report to Day Surgery.[facility]) To find out your arrival time please call [phone] between 1PM - 3PM on January 02, 2015 (Tuesday).  Remember: Instructions that are not followed completely may result in serious medical risk, up to and including death, or upon the discretion of your surgeon and anesthesiologist your surgery may need to be rescheduled.    __x__ 1. Do not eat food or drink liquids after midnight. No gum chewing or hard candies.     __x__ 2. No Alcohol for 24 hours before or after surgery.   ____ 3. Bring all medications with you on the day of surgery if instructed.    __x__ 4. Notify your doctor if there is any change in your medical condition     (cold, fever, infections).     Do not wear jewelry, make-up, hairpins, clips or nail polish.  Do not wear lotions, powders, or perfumes. You may wear deodorant.  Do not shave 48 hours prior to surgery. Men may shave face and neck.  Do not bring valuables to the hospital.    [facility] is not responsible for any belongings or valuables.               Contacts, dentures or bridgework may not be worn into surgery.  Leave your suitcase in the car. After surgery it may be brought to your room.  For patients admitted to the hospital, discharge time is determined by your                treatment team.   Patients discharged the day of surgery will not be allowed to drive home.   Please read over the following fact sheets that you were given:      ____ Take these medicines the morning of surgery with A SIP OF WATER:    1.   2.   3.   4.  5.  6.  ____ Fleet Enema (as directed)   ____ Use CHG Soap as directed  ____ Use inhalers on the day of surgery  __x__ Stop metformin 2 days prior to surgery (STOP METFORMIN ON December 5 )    ____ Take 1/2 of usual insulin dose the night before surgery and none on the morning of surgery.   ____ Stop  Coumadin/Plavix/aspirin on (PATIENT HAS STOPPED ASPIRIN ON November 23_  ____ Stop Anti-inflammatories on    ____ Stop supplements until after surgery.    ____ Bring C-Pap to the hospital.

## 2014-12-26 ENCOUNTER — Telehealth: Payer: Self-pay | Admitting: Gastroenterology

## 2014-12-26 ENCOUNTER — Other Ambulatory Visit: Payer: BLUE CROSS/BLUE SHIELD

## 2014-12-26 NOTE — Telephone Encounter (Signed)
Patients wife, Stanton Kidney, left a voice message to cancel colonoscopy. No reason given. She stated they would call back to reschedule. Called Karena Addison at [facility]

## 2015-01-03 ENCOUNTER — Ambulatory Visit
Admission: RE | Admit: 2015-01-03 | Discharge: 2015-01-03 | Disposition: A | Discharge status: Home / Self Care | Payer: BLUE CROSS/BLUE SHIELD | Source: Ambulatory Visit | Attending: Otolaryngology | Admitting: Otolaryngology

## 2015-01-03 ENCOUNTER — Ambulatory Visit: Payer: BLUE CROSS/BLUE SHIELD | Admitting: Anesthesiology

## 2015-01-03 ENCOUNTER — Encounter
Admission: RE | Disposition: A | Discharge status: Home / Self Care | Payer: Self-pay | Source: Ambulatory Visit | Attending: Otolaryngology

## 2015-01-03 DIAGNOSIS — Z7984 Long term (current) use of oral hypoglycemic drugs: Secondary | ICD-10-CM | POA: Insufficient documentation

## 2015-01-03 DIAGNOSIS — Z7982 Long term (current) use of aspirin: Secondary | ICD-10-CM | POA: Diagnosis not present

## 2015-01-03 DIAGNOSIS — D110 Benign neoplasm of parotid gland: Secondary | ICD-10-CM | POA: Diagnosis not present

## 2015-01-03 DIAGNOSIS — E7800 Pure hypercholesterolemia, unspecified: Secondary | ICD-10-CM | POA: Insufficient documentation

## 2015-01-03 DIAGNOSIS — Z9889 Other specified postprocedural states: Secondary | ICD-10-CM | POA: Insufficient documentation

## 2015-01-03 DIAGNOSIS — M1991 Primary osteoarthritis, unspecified site: Secondary | ICD-10-CM | POA: Diagnosis not present

## 2015-01-03 DIAGNOSIS — K118 Other diseases of salivary glands: Secondary | ICD-10-CM | POA: Diagnosis present

## 2015-01-03 DIAGNOSIS — Z79899 Other long term (current) drug therapy: Secondary | ICD-10-CM | POA: Diagnosis not present

## 2015-01-03 DIAGNOSIS — E119 Type 2 diabetes mellitus without complications: Secondary | ICD-10-CM | POA: Diagnosis not present

## 2015-01-03 DIAGNOSIS — I10 Essential (primary) hypertension: Secondary | ICD-10-CM | POA: Insufficient documentation

## 2015-01-03 DIAGNOSIS — F17200 Nicotine dependence, unspecified, uncomplicated: Secondary | ICD-10-CM | POA: Insufficient documentation

## 2015-01-03 DIAGNOSIS — R221 Localized swelling, mass and lump, neck: Secondary | ICD-10-CM | POA: Diagnosis present

## 2015-01-03 HISTORY — PX: PAROTIDECTOMY: SHX2163

## 2015-01-03 LAB — GLUCOSE, CAPILLARY
GLUCOSE-CAPILLARY: 168 mg/dL — AB (ref 65–99)
Glucose-Capillary: 129 mg/dL — ABNORMAL HIGH (ref 65–99)
Glucose-Capillary: 148 mg/dL — ABNORMAL HIGH (ref 65–99)

## 2015-01-03 SURGERY — EXCISION, PAROTID GLAND
Anesthesia: General | Laterality: Right

## 2015-01-03 MED ORDER — ONDANSETRON HCL 4 MG PO TABS: 4.0000 mg | ORAL_TABLET | ORAL | Status: DC | PRN

## 2015-01-03 MED ORDER — DEXMEDETOMIDINE HCL IN NACL 200 MCG/50ML IV SOLN
INTRAVENOUS | Status: DC | PRN
  Administered 2015-01-03: .2 ug/kg/h via INTRAVENOUS

## 2015-01-03 MED ORDER — GLYCOPYRROLATE 0.2 MG/ML IJ SOLN
INTRAMUSCULAR | Status: DC | PRN
  Administered 2015-01-03: 0.2 mg via INTRAVENOUS

## 2015-01-03 MED ORDER — HYDROCODONE-ACETAMINOPHEN 5-325 MG PO TABS: 1.0000 | ORAL_TABLET | ORAL | Status: AC | PRN

## 2015-01-03 MED ORDER — SUCCINYLCHOLINE CHLORIDE 20 MG/ML IJ SOLN
INTRAMUSCULAR | Status: DC | PRN
  Administered 2015-01-03: 100 mg via INTRAVENOUS

## 2015-01-03 MED ORDER — KETAMINE HCL 10 MG/ML IJ SOLN
INTRAMUSCULAR | Status: DC | PRN
  Administered 2015-01-03 (×3): 10 mg via INTRAVENOUS

## 2015-01-03 MED ORDER — LIDOCAINE HCL (CARDIAC) 20 MG/ML IV SOLN
INTRAVENOUS | Status: DC | PRN
  Administered 2015-01-03: 80 mg via INTRAVENOUS

## 2015-01-03 MED ORDER — ACETAMINOPHEN 10 MG/ML IV SOLN
INTRAVENOUS | Status: AC
  Filled 2015-01-03: qty 100

## 2015-01-03 MED ORDER — BACITRACIN ZINC 500 UNIT/GM EX OINT
TOPICAL_OINTMENT | CUTANEOUS | Status: DC | PRN
  Administered 2015-01-03: 1 via TOPICAL

## 2015-01-03 MED ORDER — ACETAMINOPHEN 10 MG/ML IV SOLN
INTRAVENOUS | Status: DC | PRN
  Administered 2015-01-03: 1000 mg via INTRAVENOUS

## 2015-01-03 MED ORDER — DEXAMETHASONE SODIUM PHOSPHATE 10 MG/ML IJ SOLN
INTRAMUSCULAR | Status: DC | PRN
  Administered 2015-01-03: 10 mg via INTRAVENOUS

## 2015-01-03 MED ORDER — FAMOTIDINE 20 MG PO TABS
ORAL_TABLET | ORAL | Status: AC
  Administered 2015-01-03: 20 mg via ORAL
  Filled 2015-01-03: qty 1

## 2015-01-03 MED ORDER — FENTANYL CITRATE (PF) 100 MCG/2ML IJ SOLN
INTRAMUSCULAR | Status: AC
Start: 2015-01-03 — End: 2015-01-03
  Administered 2015-01-03: 25 ug via INTRAVENOUS
  Filled 2015-01-03: qty 2

## 2015-01-03 MED ORDER — HYDROCODONE-ACETAMINOPHEN 5-325 MG PO TABS: 1.0000 | ORAL_TABLET | ORAL | Status: DC | PRN

## 2015-01-03 MED ORDER — SODIUM CHLORIDE 0.9 % IV SOLN
INTRAVENOUS | Status: DC
  Administered 2015-01-03: 07:00:00 via INTRAVENOUS

## 2015-01-03 MED ORDER — FENTANYL CITRATE (PF) 100 MCG/2ML IJ SOLN
25.0000 ug | INTRAMUSCULAR | Status: DC | PRN
  Administered 2015-01-03 (×4): 25 ug via INTRAVENOUS

## 2015-01-03 MED ORDER — PROPOFOL 10 MG/ML IV BOLUS
INTRAVENOUS | Status: DC | PRN
  Administered 2015-01-03: 200 mg via INTRAVENOUS

## 2015-01-03 MED ORDER — MORPHINE SULFATE (PF) 2 MG/ML IV SOLN: 2.0000 mg | INTRAVENOUS | Status: DC | PRN

## 2015-01-03 MED ORDER — LIDOCAINE-EPINEPHRINE (PF) 1 %-1:200000 IJ SOLN
INTRAMUSCULAR | Status: DC | PRN
  Administered 2015-01-03: 6 mL

## 2015-01-03 MED ORDER — ONDANSETRON HCL 4 MG/2ML IJ SOLN
INTRAMUSCULAR | Status: DC | PRN
  Administered 2015-01-03: 4 mg via INTRAVENOUS

## 2015-01-03 MED ORDER — BACITRACIN ZINC 500 UNIT/GM EX OINT
1.0000 "application " | TOPICAL_OINTMENT | Freq: Three times a day (TID) | CUTANEOUS | Status: DC
  Filled 2015-01-03 (×3): qty 0.9

## 2015-01-03 MED ORDER — BACITRACIN ZINC 500 UNIT/GM EX OINT
TOPICAL_OINTMENT | CUTANEOUS | Status: AC
  Filled 2015-01-03: qty 28.35

## 2015-01-03 MED ORDER — ROCURONIUM BROMIDE 100 MG/10ML IV SOLN
INTRAVENOUS | Status: DC | PRN
  Administered 2015-01-03: 10 mg via INTRAVENOUS

## 2015-01-03 MED ORDER — FENTANYL CITRATE (PF) 250 MCG/5ML IJ SOLN
INTRAMUSCULAR | Status: DC | PRN
  Administered 2015-01-03: 50 ug via INTRAVENOUS
  Administered 2015-01-03: 100 ug via INTRAVENOUS
  Administered 2015-01-03 (×2): 50 ug via INTRAVENOUS

## 2015-01-03 MED ORDER — FAMOTIDINE 20 MG PO TABS
20.0000 mg | ORAL_TABLET | Freq: Once | ORAL | Status: AC
Start: 2015-01-03 — End: 2015-01-03
  Administered 2015-01-03: 20 mg via ORAL

## 2015-01-03 MED ORDER — MIDAZOLAM HCL 5 MG/5ML IJ SOLN
INTRAMUSCULAR | Status: DC | PRN
  Administered 2015-01-03: 2 mg via INTRAVENOUS

## 2015-01-03 MED ORDER — PROMETHAZINE HCL 25 MG/ML IJ SOLN: 6.2500 mg | INTRAMUSCULAR | Status: DC | PRN

## 2015-01-03 MED ORDER — INSULIN ASPART 100 UNIT/ML ~~LOC~~ SOLN
0.0000 [IU] | Freq: Three times a day (TID) | SUBCUTANEOUS | Status: DC
  Administered 2015-01-03: 3 [IU] via SUBCUTANEOUS
  Filled 2015-01-03: qty 3

## 2015-01-03 MED ORDER — LIDOCAINE-EPINEPHRINE (PF) 1 %-1:200000 IJ SOLN
INTRAMUSCULAR | Status: AC
  Filled 2015-01-03: qty 30

## 2015-01-03 MED ORDER — POTASSIUM CHLORIDE IN NACL 20-0.9 MEQ/L-% IV SOLN
INTRAVENOUS | Status: DC
  Administered 2015-01-03: 12:00:00 via INTRAVENOUS
  Filled 2015-01-03 (×2): qty 1000

## 2015-01-03 MED ORDER — ONDANSETRON HCL 4 MG/2ML IJ SOLN: 4.0000 mg | INTRAMUSCULAR | Status: DC | PRN

## 2015-01-03 SURGICAL SUPPLY — 33 items
ADHESIVE MASTISOL STRL (MISCELLANEOUS) ×3 IMPLANT
BLADE SURG 15 STRL LF DISP TIS (BLADE) ×1 IMPLANT
BLADE SURG 15 STRL SS (BLADE) ×2
CORD BIP STRL DISP 12FT (MISCELLANEOUS) ×3 IMPLANT
DRAPE MAG INST 16X20 L/F (DRAPES) ×3 IMPLANT
DRAPE SURG 17X11 SM STRL (DRAPES) ×6 IMPLANT
DRESSING TELFA 4X3 1S ST N-ADH (GAUZE/BANDAGES/DRESSINGS) ×3 IMPLANT
DRSG TEGADERM 2-3/8X2-3/4 SM (GAUZE/BANDAGES/DRESSINGS) ×9 IMPLANT
DRSG TEGADERM 4X4.75 (GAUZE/BANDAGES/DRESSINGS) ×3 IMPLANT
ELECT EMG 20MM DUAL (MISCELLANEOUS) ×3
ELECT NEEDLE 20X.3 GREEN (MISCELLANEOUS) ×3
ELECTRODE EMG 20MM DUAL (MISCELLANEOUS) ×1 IMPLANT
ELECTRODE NEEDLE 20X.3 GREEN (MISCELLANEOUS) ×1 IMPLANT
FORCEPS JEWEL BIP 4-3/4 STR (INSTRUMENTS) ×3 IMPLANT
GAUZE SPONGE 4X4 12PLY STRL (GAUZE/BANDAGES/DRESSINGS) ×3 IMPLANT
GLOVE BIO SURGEON STRL SZ7.5 (GLOVE) ×24 IMPLANT
GOWN STRL REUS W/ TWL LRG LVL3 (GOWN DISPOSABLE) ×3 IMPLANT
GOWN STRL REUS W/TWL LRG LVL3 (GOWN DISPOSABLE) ×6
HARMONIC SCALPEL FOCUS (MISCELLANEOUS) ×3 IMPLANT
HOOK STAY BLUNT/RETRACTOR 5M (MISCELLANEOUS) ×3 IMPLANT
KIT RM TURNOVER STRD PROC AR (KITS) ×3 IMPLANT
LABEL OR SOLS (LABEL) ×3 IMPLANT
PACK HEAD/NECK (MISCELLANEOUS) ×3 IMPLANT
PAD GROUND ADULT SPLIT (MISCELLANEOUS) ×3 IMPLANT
PROBE MONO 100X0.75 ELECT 1.9M (MISCELLANEOUS) ×3 IMPLANT
SPONGE KITTNER 5P (MISCELLANEOUS) ×9 IMPLANT
SPONGE XRAY 4X4 16PLY STRL (MISCELLANEOUS) ×3 IMPLANT
SUT PROLENE 5 0 PS 3 (SUTURE) ×6 IMPLANT
SUT SILK 2 0 (SUTURE)
SUT SILK 2-0 18XBRD TIE 12 (SUTURE) IMPLANT
SUT SILK 4 0 (SUTURE)
SUT SILK 4-0 18XBRD TIE 12 (SUTURE) IMPLANT
SUT VIC AB 4-0 RB1 18 (SUTURE) ×3 IMPLANT

## 2015-01-03 NOTE — Anesthesia Procedure Notes (Signed)
Procedure Name: Intubation Date/Time: 01/03/2015 7:39 AM Performed by: Delaney Meigs Pre-anesthesia Checklist: Patient identified, Emergency Drugs available, Suction available, Patient being monitored and Timeout performed Patient Re-evaluated:Patient Re-evaluated prior to inductionOxygen Delivery Method: Circle system utilized Preoxygenation: Pre-oxygenation with 100% oxygen Intubation Type: IV induction Ventilation: Mask ventilation without difficulty Laryngoscope Size: Mac and 4 Grade View: Grade IV Tube type: Oral Tube size: 7.5 mm Number of attempts: 1 Airway Equipment and Method: Stylet Placement Confirmation: ETT inserted through vocal cords under direct vision,  positive ETCO2 and breath sounds checked- equal and bilateral Secured at: 22 cm Tube secured with: Tape Dental Injury: Teeth and Oropharynx as per pre-operative assessment

## 2015-01-03 NOTE — Anesthesia Postprocedure Evaluation (Signed)
Anesthesia Post Note  Patient: Randall Bowman  Procedure(s) Performed: Procedure(s) (LRB): PAROTIDECTOMY (Right)  Patient location during evaluation: PACU Anesthesia Type: General Level of consciousness: awake and alert Pain management: pain level controlled Vital Signs Assessment: post-procedure vital signs reviewed and stable Respiratory status: spontaneous breathing, nonlabored ventilation, respiratory function stable and patient connected to nasal cannula oxygen Cardiovascular status: blood pressure returned to baseline and stable Postop Assessment: no signs of nausea or vomiting Anesthetic complications: no    Last Vitals:  Filed Vitals:   01/03/15 1118 01/03/15 1217  BP: 113/75 117/72  Pulse: 78 76  Temp: 36.4 C 36.3 C  Resp: 20 17    Last Pain:  Filed Vitals:   01/03/15 1347  PainSc: 0-No pain                 Martha Clan

## 2015-01-03 NOTE — Progress Notes (Signed)
Alert and oriented. Vss. No signs of acute distress. Discharge instructions given. Patient and wife verbalized understanding. TLS drain intact. Appointment set up for tomorrow.

## 2015-01-03 NOTE — Anesthesia Preprocedure Evaluation (Signed)
Anesthesia Evaluation  Patient identified by MRN, date of birth, ID band Patient awake    Reviewed: Allergy & Precautions, H&P , NPO status , Patient's Chart, lab work & pertinent test results, reviewed documented beta blocker date and time   History of Anesthesia Complications Negative for: history of anesthetic complications  Airway Mallampati: IV  TM Distance: >3 FB Neck ROM: full    Dental no notable dental hx. (+) Missing, Chipped, Poor Dentition   Pulmonary neg shortness of breath, neg sleep apnea, neg COPD, neg recent URI, Current Smoker,    Pulmonary exam normal breath sounds clear to auscultation       Cardiovascular Exercise Tolerance: Good negative cardio ROS Normal cardiovascular exam Rhythm:regular Rate:Normal     Neuro/Psych neg Seizures  Neuromuscular disease (peripheral neuropathy) negative psych ROS   GI/Hepatic negative GI ROS, Neg liver ROS,   Endo/Other  diabetes  Renal/GU negative Renal ROS  negative genitourinary   Musculoskeletal   Abdominal   Peds  Hematology negative hematology ROS (+)   Anesthesia Other Findings Past Medical History:   Tears of meniscus and ACL of left knee                       Tear of MCL (medial collateral ligament) of kn*              Joint capsule rupture                                        Diabetes mellitus without complication (HCC)                 Acute epiglottitis                                           Arthritis                                                    Reproductive/Obstetrics negative OB ROS                             Anesthesia Physical Anesthesia Plan  ASA: III  Anesthesia Plan: General   Post-op Pain Management:    Induction:   Airway Management Planned:   Additional Equipment:   Intra-op Plan:   Post-operative Plan:   Informed Consent: I have reviewed the patients History and Physical, chart, labs  and discussed the procedure including the risks, benefits and alternatives for the proposed anesthesia with the patient or authorized representative who has indicated his/her understanding and acceptance.   Dental Advisory Given  Plan Discussed with: Anesthesiologist, CRNA and Surgeon  Anesthesia Plan Comments:         Anesthesia Quick Evaluation

## 2015-01-03 NOTE — H&P (Signed)
History and physical reviewed and will be scanned in later. No change in medical status reported by the patient or family, appears stable for surgery. All questions regarding the procedure answered, and patient (or family if a child) expressed understanding of the procedure.  [name] @TODAY@ 

## 2015-01-03 NOTE — Op Note (Signed)
01/03/2015  10:04 AM    Randall Bowman  WU:[id number]   Pre-Op Diagnosis:  RIGHT PAROTID MASS Post-op Diagnosis: SAME  Procedure: Right Superficial Parotidectomy with facial nerve dissection, facial nerve monitoring Surgeon:  Riley Nearing Assistant: Margaretha Sheffield, M.D.  Anesthesia:  General endotracheal anesthesia  EBL:  Less than 25 cc  Complications:  None  Findings: 3 x 2.5 cm right tail of parotid mass  Procedure: The patient was taken to the Operating Room and placed in the supine position.  After induction of general endotracheal anesthesia, the patient was turned 90 degrees and placed on a shoulder roll. The skin was injected along the proposed incision line over the right parotid region and neck with 1% lidocaine with epinephrine, 1: 200,000. The facial nerve monitor electrodes were placed in the usual fashion at the brow and lower lip on the same side of the procedure. Proper functioning of the nerve monitor was assessed. The area was then prepped and draped in the usual sterile fashion.  A 15 blade was then used to incise the skin, starting in front of the tragus, and curving below the ear lobe into the neck in a gentle S shaped incision. The dissection was carried down to the subcutaneous tissues with the Bovie, exposing the sternocleidomastoid muscle in the neck. A cervicofacial skin flap was then elevated with scissors, superficial to the parotid fascia, and anteriorly just beyond anterior border of the mass. The flap was then retracted anteriorly with surgical stays. Dissection then proceeded along the tragal cartilage and more inferiorly, separating the parotid gland from the mastoid tip and the sternocleidomastoid muscle. Dissection anterior to the SCM identified the digastric muscle for anatomic reference. The greater auricular nerve required division for adequate exposure for the procedure. During dissection tissues were divided with either the bipolar cautery or the  harmonic scalpel, utilizing the latter instrument when dividing larger blood vessels for hemostasis. Care was taken not to violate the capsule of the parotid mass. In this fashion wide exposure was obtained, which facilitated exposure and identification of the facial nerve at the stylomastoid foramen. This was confirmed using the nerve stimulator on a low setting. Careful dissection then proceeded along the trunk of the facial nerve, and subsequently along the branches of the nerve, dissecting the superficial portion of the parotid gland, with the associated mass, away from the branches of the facial nerve. The face was monitored for movement throughout the procedure, and tissues carefully divided with either the bipolar cautery or harmonic scalpel. Care was taken to identify all branches of the facial nerve and preserve any communicating branches. The dissection proceeded anteriorly until the dissection was beyond the anterior margin of the parotid mass. With the facial nerve completley separated from the specimen, the anterior margin of the specimen was divided with the harmonic scalpel.   The specimen was inspected and the mass found completely excised, and sent for pathology. The wound was irrigated with saline and hemostasis obtained. A #10 TLS drain was placed through a separate stab incision in the skin posterior to the incision in the neck, and secured with a 4-0 Vicryl suture. Deep closure was performed with 4-0 Vicryl suture to close the dead space from the resection as much as possible. The facial nerve was stimulated and found to stimulate appropriately. The subcutaneous tissues were then closed with 4-0 Vicryl suture in an interrupted fashion. The skin was closed with 5-0 Prolene suture in a running locked stitch. Bacitracin ointment was applied to the  wound.  The patient was then returned to the anesthesiologist for awakening, and was taken to the Recovery Room in stable  condition.   Disposition:   PACU then to floor for observation  Plan: Anticipate discharge home later this afternoon, leaving drain in place until tomorrow. Follow-up in the office for drain removal.  Riley Nearing 01/03/2015 10:04 AM

## 2015-01-03 NOTE — Transfer of Care (Signed)
Immediate Anesthesia Transfer of Care Note  Patient: Randall Bowman  Procedure(s) Performed: Procedure(s): PAROTIDECTOMY (Right)  Patient Location: PACU  Anesthesia Type:General  Level of Consciousness: awake, alert  and oriented  Airway & Oxygen Therapy: Patient Spontanous Breathing and Patient connected to face mask oxygen  Post-op Assessment: Report given to RN and Post -op Vital signs reviewed and stable  Post vital signs: Reviewed and stable  Last Vitals: 10:19 98% 131/82 77hr 18 resp  Filed Vitals:   01/03/15 0652  BP: 125/75  Pulse: 78  Temp: 36.1 C  Resp: 20    Complications: No apparent anesthesia complications

## 2015-01-04 LAB — SURGICAL PATHOLOGY

## 2015-01-12 ENCOUNTER — Encounter: Admission: RE | Payer: Self-pay | Source: Ambulatory Visit

## 2015-01-12 ENCOUNTER — Ambulatory Visit
Admission: RE | Admit: 2015-01-12 | Payer: BLUE CROSS/BLUE SHIELD | Source: Ambulatory Visit | Admitting: Gastroenterology

## 2015-01-12 SURGERY — COLONOSCOPY WITH PROPOFOL
Anesthesia: Choice

## 2015-01-22 ENCOUNTER — Telehealth: Payer: Self-pay

## 2015-01-27 NOTE — Telephone Encounter (Signed)
done

## 2015-02-01 ENCOUNTER — Ambulatory Visit: Payer: BLUE CROSS/BLUE SHIELD | Admitting: Family Medicine

## 2016-02-25 IMAGING — CR DG NECK SOFT TISSUE
1 series · 2 of 2 positions shown · non-contrast
Comparison: None.

CLINICAL DATA: Dysphagia.

EXAM:
NECK SOFT TISSUES - 1+ VIEW

[Series 1: dg neck soft tissue · 0.14mm/px · 2 of 2 slices shown]
[im 1/2]
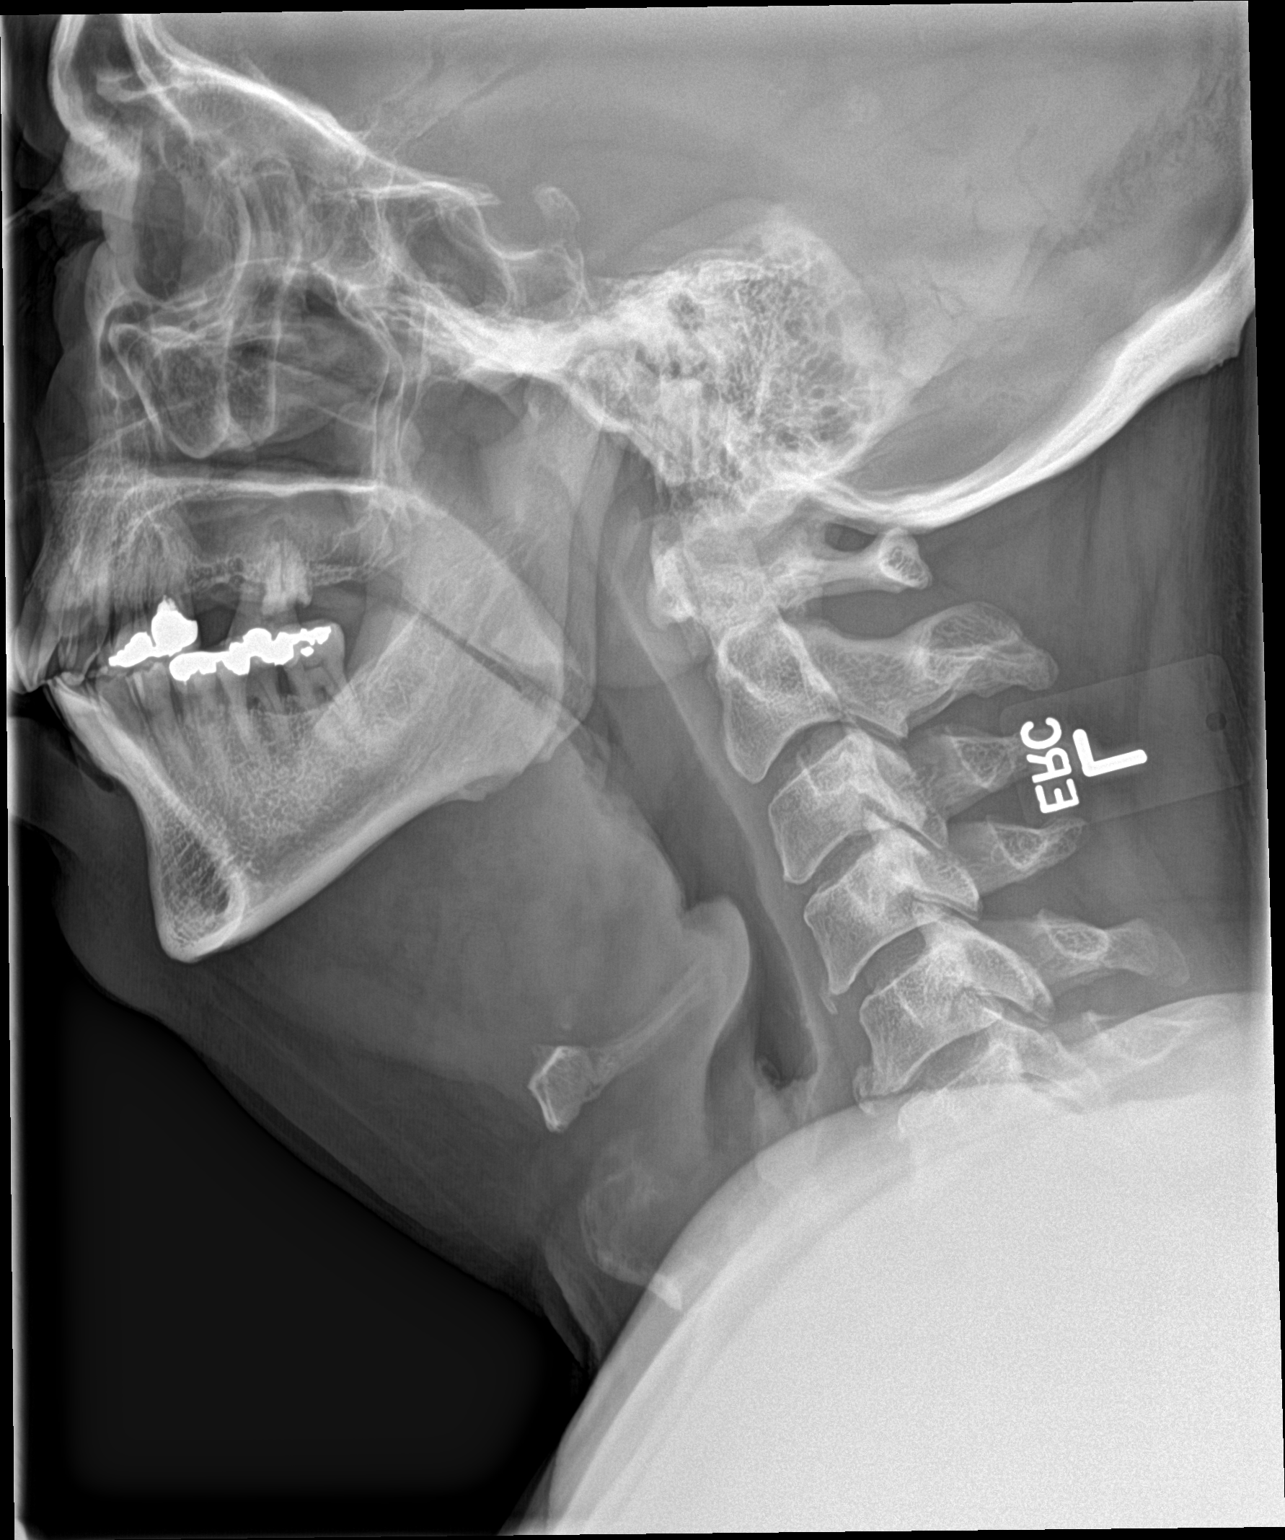
[im 2/2]
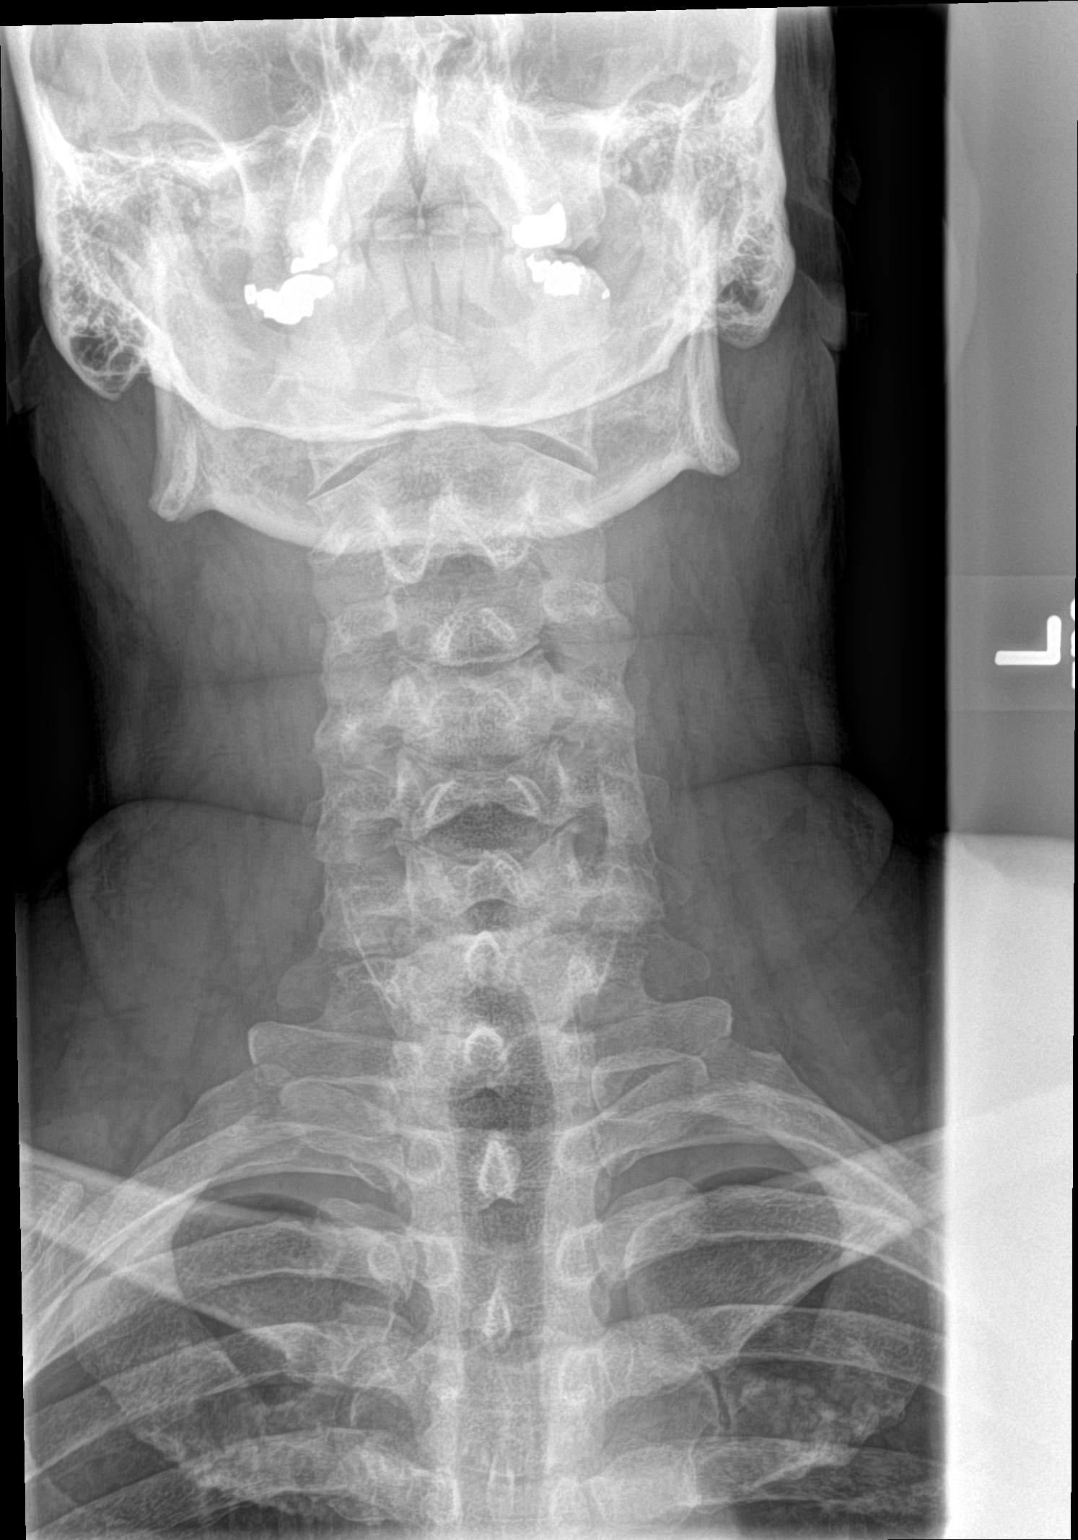

[2 of 2 positions shown; findings below may reference images not displayed]

FINDINGS: There is no evidence of retropharyngeal soft tissue swelling.
Epiglottis looks mildly thickened and therefore some degree of
epiglottitis cannot be excluded. The cervical airway is unremarkable
and no radio-opaque foreign body identified.
IMPRESSION: Epiglottis appears to be mildly thickened which is concerning for
epiglottitis. Clinical correlation is recommended.
